# Patient Record
Sex: Male | Born: 1975 | Race: White | Hispanic: No | Marital: Single | Smoking: Current every day smoker
Health system: Southern US, Community
[De-identification: ages and names within clinical notes are randomized; demographics above are authoritative.]

## PROBLEM LIST (undated history)

## (undated) DIAGNOSIS — F1190 Opioid use, unspecified, uncomplicated: Secondary | ICD-10-CM

## (undated) DIAGNOSIS — Z720 Tobacco use: Secondary | ICD-10-CM

## (undated) DIAGNOSIS — I2510 Atherosclerotic heart disease of native coronary artery without angina pectoris: Secondary | ICD-10-CM

## (undated) DIAGNOSIS — F1910 Other psychoactive substance abuse, uncomplicated: Secondary | ICD-10-CM

## (undated) HISTORY — DX: Tobacco use: Z72.0

## (undated) HISTORY — DX: Opioid use, unspecified, uncomplicated: F11.90

## (undated) HISTORY — PX: MULTIPLE TOOTH EXTRACTIONS: SHX2053

## (undated) HISTORY — DX: Other psychoactive substance abuse, uncomplicated: F19.10

## (undated) HISTORY — DX: Atherosclerotic heart disease of native coronary artery without angina pectoris: I25.10

---

## 2006-07-02 ENCOUNTER — Inpatient Hospital Stay (HOSPITAL_COMMUNITY): Admission: AC | Admit: 2006-07-02 | Discharge: 2006-07-03 | Payer: Self-pay

## 2006-07-03 ENCOUNTER — Ambulatory Visit: Payer: Self-pay | Admitting: Dentistry

## 2006-07-18 ENCOUNTER — Ambulatory Visit (HOSPITAL_COMMUNITY): Admission: RE | Admit: 2006-07-18 | Discharge: 2006-07-18 | Payer: Self-pay | Admitting: Dentistry

## 2006-07-25 ENCOUNTER — Encounter: Admission: EM | Admit: 2006-07-25 | Discharge: 2006-07-25 | Payer: Self-pay | Admitting: Dentistry
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
## 2007-10-31 IMAGING — CR DG CHEST 2V
2 series · 2 of 2 positions shown · non-contrast
Comparison: none

CLINICAL DATA: Chronic periodontitis. 
 CHEST ? 2 VIEW:

[view not recorded (1 of 2)]
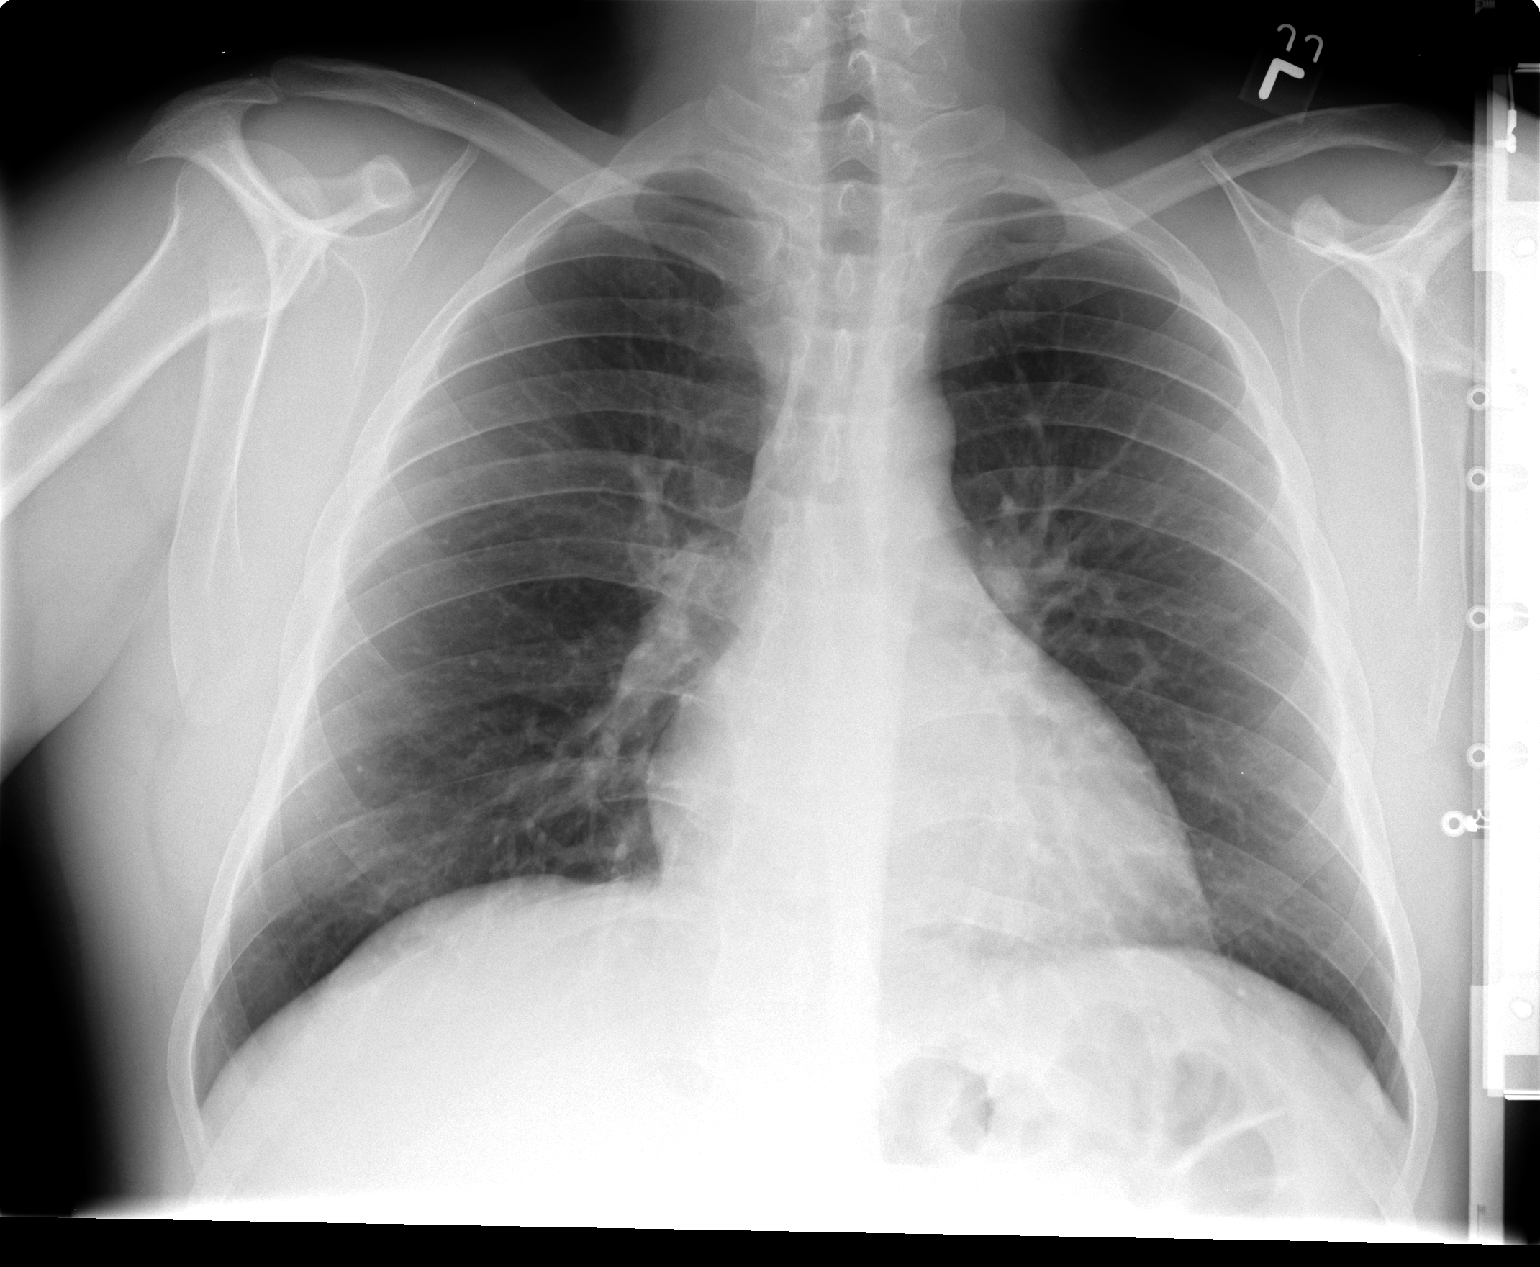

[view not recorded (2 of 2)]
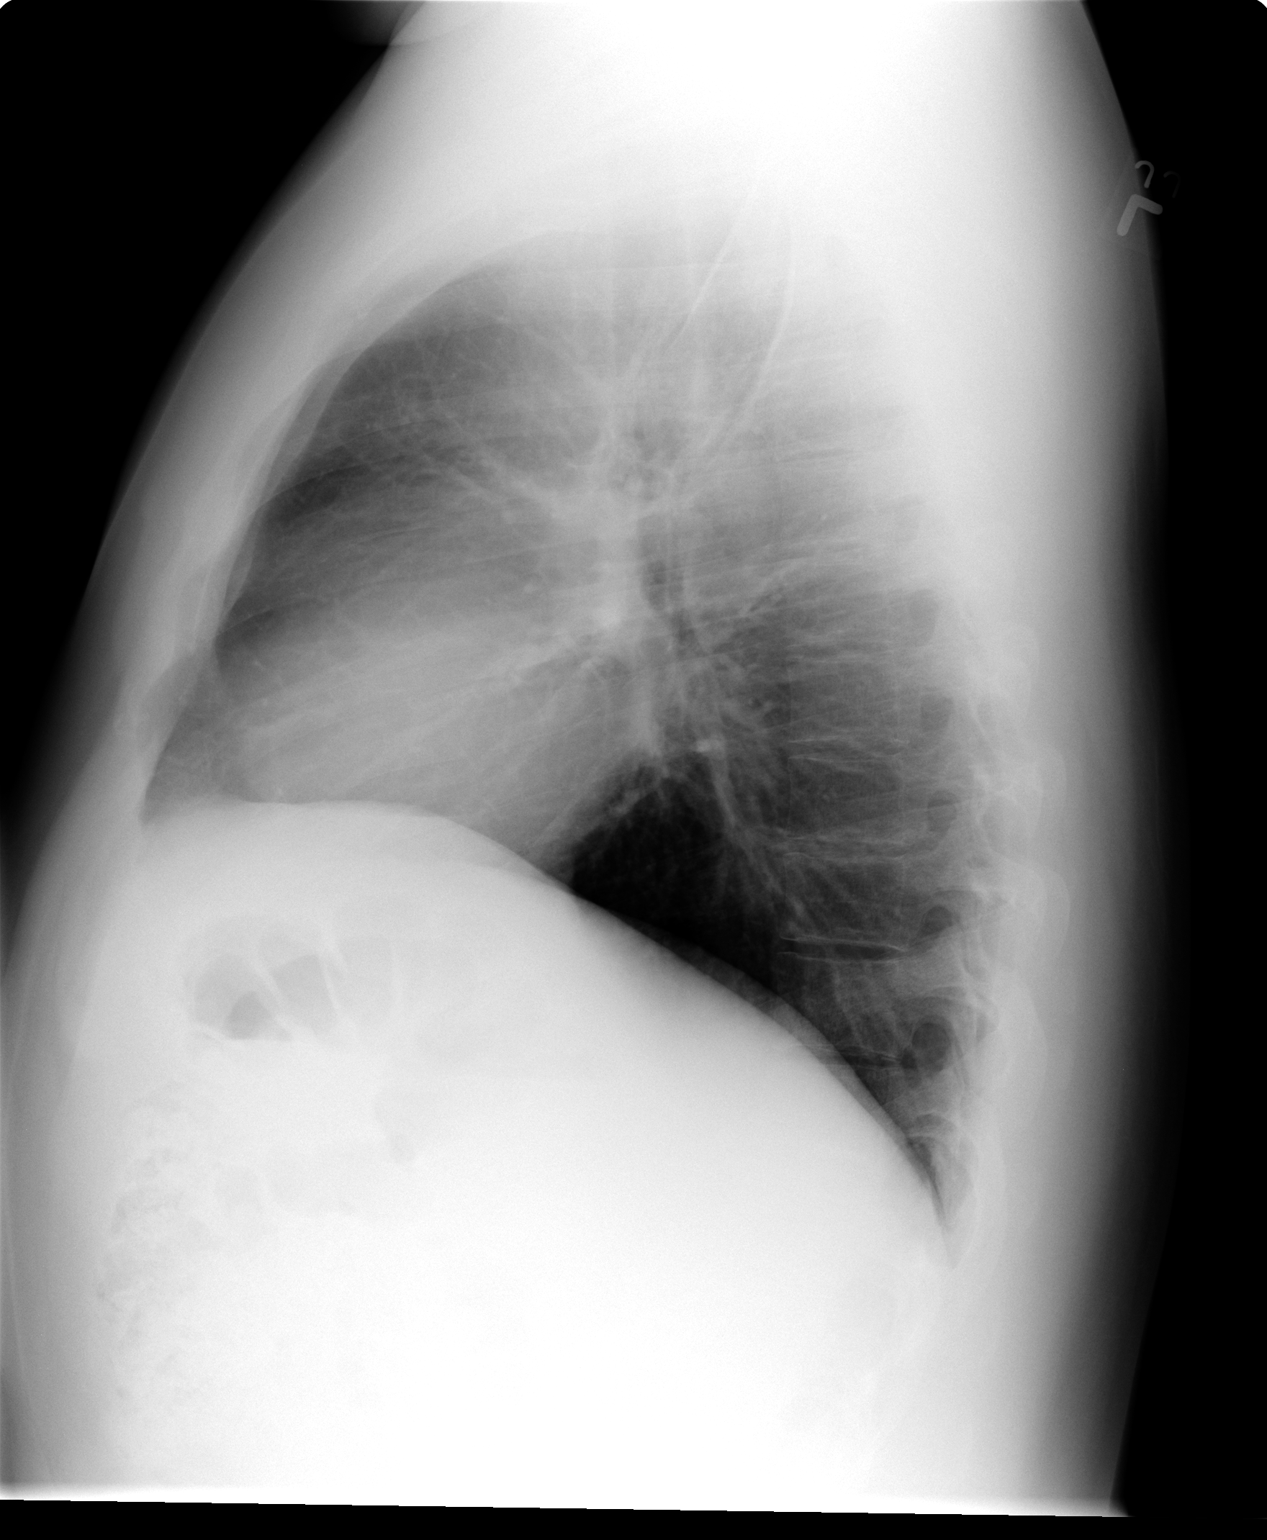

[2 of 2 positions shown; findings below may reference images not displayed]

FINDINGS: The heart size and mediastinal contours are within normal limits.  Both lungs are clear.  The visualized skeletal structures are unremarkable.
IMPRESSION: No active cardiopulmonary disease.

## 2021-02-25 DIAGNOSIS — J982 Interstitial emphysema: Secondary | ICD-10-CM | POA: Insufficient documentation

## 2021-02-25 DIAGNOSIS — F1190 Opioid use, unspecified, uncomplicated: Secondary | ICD-10-CM | POA: Insufficient documentation

## 2021-03-14 DIAGNOSIS — I2510 Atherosclerotic heart disease of native coronary artery without angina pectoris: Secondary | ICD-10-CM | POA: Insufficient documentation

## 2021-03-14 DIAGNOSIS — Z720 Tobacco use: Secondary | ICD-10-CM | POA: Insufficient documentation

## 2021-03-14 DIAGNOSIS — F1910 Other psychoactive substance abuse, uncomplicated: Secondary | ICD-10-CM | POA: Insufficient documentation

## 2022-08-20 ENCOUNTER — Ambulatory Visit: Payer: 59 | Admitting: Internal Medicine

## 2022-09-19 ENCOUNTER — Ambulatory Visit: Payer: 59 | Admitting: Internal Medicine

## 2022-09-19 ENCOUNTER — Encounter: Payer: Self-pay | Admitting: Internal Medicine

## 2022-09-19 VITALS — BP 104/66 | HR 89 | Temp 98.0°F | Ht 69.0 in | Wt 147.4 lb

## 2022-09-19 DIAGNOSIS — F1910 Other psychoactive substance abuse, uncomplicated: Secondary | ICD-10-CM | POA: Diagnosis not present

## 2022-09-19 DIAGNOSIS — K219 Gastro-esophageal reflux disease without esophagitis: Secondary | ICD-10-CM | POA: Diagnosis not present

## 2022-09-19 DIAGNOSIS — I2510 Atherosclerotic heart disease of native coronary artery without angina pectoris: Secondary | ICD-10-CM

## 2022-09-19 DIAGNOSIS — Z6821 Body mass index (BMI) 21.0-21.9, adult: Secondary | ICD-10-CM | POA: Diagnosis not present

## 2022-09-19 DIAGNOSIS — F1190 Opioid use, unspecified, uncomplicated: Secondary | ICD-10-CM

## 2022-09-19 MED ORDER — FAMOTIDINE 40 MG PO TABS: 40.0000 mg | ORAL_TABLET | Freq: Two times a day (BID) | ORAL | 2 refills | Status: DC

## 2022-09-19 MED ORDER — ASPIRIN 81 MG PO TBEC: 81.0000 mg | DELAYED_RELEASE_TABLET | Freq: Every day | ORAL | 12 refills | Status: AC

## 2022-09-19 MED ORDER — ATORVASTATIN CALCIUM 40 MG PO TABS: 40.0000 mg | ORAL_TABLET | Freq: Every day | ORAL | 2 refills | Status: DC
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
## 2022-09-19 NOTE — Assessment & Plan Note (Signed)
Plan as above.  

## 2022-09-19 NOTE — Assessment & Plan Note (Signed)
I obtained an EKG today and this showed NSR.  We will start him on ASA and a statin.  We will refer him back to cardiologist.

## 2022-09-19 NOTE — Assessment & Plan Note (Signed)
He has opoid use disorder with polysubstance abuse.  I am going to set him up with pain mangement who can have psychology and addiction specialities on board.  We will get him setup ASAP.

## 2022-09-19 NOTE — Assessment & Plan Note (Signed)
He descrbies some reflux.  We will start him on pepcid.

## 2022-09-21 LAB — CMP14 + ANION GAP
ALT: 14 IU/L (ref 0–44)
AST: 21 IU/L (ref 0–40)
Albumin/Globulin Ratio: 2.1 (ref 1.2–2.2)
Albumin: 4.2 g/dL (ref 4.1–5.1)
Alkaline Phosphatase: 97 IU/L (ref 44–121)
Anion Gap: 13 mmol/L (ref 10.0–18.0)
BUN/Creatinine Ratio: 16 (ref 9–20)
BUN: 15 mg/dL (ref 6–24)
Bilirubin Total: <0.2 mg/dL (ref 0.0–1.2)
CO2: 23 mmol/L (ref 20–29)
Calcium: 9 mg/dL (ref 8.7–10.2)
Chloride: 103 mmol/L (ref 96–106)
Creatinine, Ser: 0.93 mg/dL (ref 0.76–1.27)
Globulin, Total: 2 g/dL (ref 1.5–4.5)
Glucose: 82 mg/dL (ref 70–99)
Potassium: 5.3 mmol/L — ABNORMAL HIGH (ref 3.5–5.2)
Sodium: 139 mmol/L (ref 134–144)
Total Protein: 6.2 g/dL (ref 6.0–8.5)
eGFR: 103 mL/min/{1.73_m2} (ref >59)

## 2022-09-21 LAB — CBC WITH DIFFERENTIAL/PLATELET
Basophils Absolute: 0 10*3/uL (ref 0.0–0.2)
Basos: 1 %
EOS (ABSOLUTE): 0.3 10*3/uL (ref 0.0–0.4)
Eos: 4 %
Hematocrit: 40.7 % (ref 37.5–51.0)
Hemoglobin: 13 g/dL (ref 13.0–17.7)
Immature Grans (Abs): 0 10*3/uL (ref 0.0–0.1)
Immature Granulocytes: 0 %
Lymphocytes Absolute: 1.3 10*3/uL (ref 0.7–3.1)
Lymphs: 17 %
MCH: 29.5 pg (ref 26.6–33.0)
MCHC: 31.9 g/dL (ref 31.5–35.7)
MCV: 92 fL (ref 79–97)
Monocytes Absolute: 0.3 10*3/uL (ref 0.1–0.9)
Monocytes: 5 %
Neutrophils Absolute: 5.5 10*3/uL (ref 1.4–7.0)
Neutrophils: 73 %
Platelets: 267 10*3/uL (ref 150–450)
RBC: 4.41 x10E6/uL (ref 4.14–5.80)
RDW: 12.5 % (ref 11.6–15.4)
WBC: 7.5 10*3/uL (ref 3.4–10.8)

## 2022-09-21 LAB — LIPID PANEL
Chol/HDL Ratio: 3.1 ratio (ref 0.0–5.0)
Cholesterol, Total: 174 mg/dL (ref 100–199)
HDL: 57 mg/dL (ref >39)
LDL Chol Calc (NIH): 105 mg/dL — ABNORMAL HIGH (ref 0–99)
Triglycerides: 62 mg/dL (ref 0–149)
VLDL Cholesterol Cal: 12 mg/dL (ref 5–40)

## 2022-09-21 LAB — TSH: TSH: 1.59 u[IU]/mL (ref 0.450–4.500)

## 2022-10-09 ENCOUNTER — Other Ambulatory Visit: Payer: Self-pay

## 2022-10-09 MED ORDER — ATORVASTATIN CALCIUM 80 MG PO TABS: 80.0000 mg | ORAL_TABLET | Freq: Every day | ORAL | 1 refills | Status: AC

## 2022-10-18 ENCOUNTER — Encounter: Payer: Self-pay | Admitting: Cardiology

## 2022-10-18 ENCOUNTER — Ambulatory Visit: Payer: 59 | Attending: Cardiology | Admitting: Cardiology

## 2022-10-18 VITALS — BP 128/74 | HR 93 | Ht 67.0 in | Wt 152.2 lb

## 2022-10-18 DIAGNOSIS — F1910 Other psychoactive substance abuse, uncomplicated: Secondary | ICD-10-CM

## 2022-10-18 DIAGNOSIS — I2510 Atherosclerotic heart disease of native coronary artery without angina pectoris: Secondary | ICD-10-CM | POA: Diagnosis not present

## 2022-10-18 DIAGNOSIS — Z720 Tobacco use: Secondary | ICD-10-CM

## 2022-10-18 DIAGNOSIS — E785 Hyperlipidemia, unspecified: Secondary | ICD-10-CM | POA: Diagnosis not present

## 2022-10-18 NOTE — Addendum Note (Signed)
Addended by: Roxanne Mins I on: 10/18/2022 02:42 PM   Modules accepted: Orders

## 2022-10-18 NOTE — Progress Notes (Signed)
Cardiology Consultation:    Date:  10/18/2022   ID:  Niyan Schoof, DOB 1975-12-27, MRN [id number]  PCP:  Crist Fat, MD  Cardiologist:  Gypsy Balsam, MD   Referring MD: Leonia Reader, Barbara Cower, MD   Chief Complaint  Patient presents with   Coronary Artery Disease    H/o drug abuse    History of Present Illness:    Jeffrey Lara is a 47 y.o. male who is being seen today for the evaluation of coronary disease at the request of Crist Fat, MD. past medical history significant for alcoholism likely he quit drinking in 2016, used to drink 12 pack.  However he switched from drinking to opioids methamphetamine and fentanyl and he got clear out of days and the only thing he is right now is Suboxone.  He was referred to [place] because in 2022 he did have CT of his chest done when he got esophageal tear he was found to have calcification of the coronary artery.  He did see Dr. Desma Maxim who wanted him to have an echocardiogram as well as stress test but it never happened.  He also they are appropriately put him on aspirin and Lipitor however he never talked to his medication.  Finally he is getting things under control and he went to see his primary care physician who referred him to [place].  He complained of having pains all over the body pains and joint.  Also pain in the chest that happen in different situations he also noted swelling of lower extremities.  He denies have any palpitations.  He does have an easily shortness of breath while walking.  He does not exercise on regular basis, he is not on any diet.  Past Medical History:  Diagnosis Date   CAD (coronary artery disease)    Opioid use disorder    Polysubstance abuse (HCC)    Tobacco abuse     Past Surgical History:  Procedure Laterality Date   MULTIPLE TOOTH EXTRACTIONS      Current Medications: Current Meds  Medication Sig   aspirin EC 81 MG tablet Take 1 tablet (81 mg total) by mouth daily. Swallow whole.   atorvastatin (LIPITOR) 80  MG tablet Take 1 tablet (80 mg total) by mouth at bedtime.   famotidine (PEPCID) 40 MG tablet Take 1 tablet (40 mg total) by mouth 2 (two) times daily.     Allergies:   Meloxicam   Social History   Socioeconomic History   Marital status: Single    Spouse name: Not on file   Number of children: Not on file   Years of education: Not on file   Highest education level: Not on file  Occupational History   Not on file  Tobacco Use   Smoking status: Every Day    Packs/day: 2    Types: Cigarettes   Smokeless tobacco: Never  Substance and Sexual Activity   Alcohol use: Not on file   Drug use: Not on file   Sexual activity: Not on file  Other Topics Concern   Not on file  Social History Narrative   Not on file   Social Determinants of Health   Financial Resource Strain: Not on file  Food Insecurity: Not on file  Transportation Needs: Not on file  Physical Activity: Not on file  Stress: Not on file  Social Connections: Not on file     Family History: The patient's family history includes Diabetes in his father; Heart attack in  his father. ROS:   Please see the history of present illness.    All 14 point review of systems negative except as described per history of present illness.  EKGs/Labs/Other Studies Reviewed:    The following studies were reviewed today:   EKG:  EKG is  ordered today.  The ekg ordered today demonstrates normal sinus rhythm, normal P interval, normal QS complex duration morphology.  Recent Labs: 09/19/2022: ALT 14; BUN 15; Creatinine, Ser 0.93; Hemoglobin 13.0; Platelets 267; Potassium 5.3; Sodium 139; TSH 1.590  Recent Lipid Panel    Component Value Date/Time   CHOL 174 09/19/2022 1627   TRIG 62 09/19/2022 1627   HDL 57 09/19/2022 1627   CHOLHDL 3.1 09/19/2022 1627   LDLCALC 105 (H) 09/19/2022 1627    Physical Exam:    VS:  BP 128/74 (BP Location: Left Arm, Patient Position: Sitting)   Pulse 93   Ht 5\' 7"  (1.702 m)   Wt 152 lb 3.2 oz  (69 kg)   SpO2 97%   BMI 23.84 kg/m     Wt Readings from Last 3 Encounters:  10/18/22 152 lb 3.2 oz (69 kg)  09/19/22 147 lb 6.4 oz (66.9 kg)     GEN:  Well nourished, well developed in no acute distress HEENT: Normal NECK: No JVD; No carotid bruits LYMPHATICS: No lymphadenopathy CARDIAC: RRR, no murmurs, no rubs, no gallops RESPIRATORY:  Clear to auscultation without rales, wheezing or rhonchi  ABDOMEN: Soft, non-tender, non-distended MUSCULOSKELETAL:  No edema; No deformity  SKIN: Warm and dry NEUROLOGIC:  Alert and oriented x 3 PSYCHIATRIC:  Normal affect   ASSESSMENT:    1. Coronary artery disease involving native coronary artery of native heart without angina pectoris   2. Tobacco abuse   3. Polysubstance abuse (HCC)   4. Dyslipidemia    PLAN:    In order of problems listed above:  Coronary artery disease.  He does have calcification of the coronary arteries discovered in 2022 on CT it is difficult to judge his symptomatology since he complained of having a lot of pains in different portion of the body including chest pain.  He does have swelling of lower extremities on the physical exam, therefore, I suspect cardiomyopathy.  I will ask him to have echocardiogram and based on that we decide which way to proceed in terms of ablation of CAD.  He is already taking aspirin which is excellent.  He is already on statin. Tobacco abuse he said he smokes about 1 pack/day but he got his fingers yellow I suspect it is more than that, obviously he was told to quit smoking. Polysubstance abuse the only thing he is right now is Suboxone. Dyslipidemia again he was just recently put on Lipitor.  I did review K PN which show me data from September 19, 2022 with LDL 105 HDL 57. Is a sad life story of this gentleman who is life being destroyed by drug addiction however he was able to shake of his alcohol drinking addiction and now he to go away a methamphetamine, heroin, fentanyl.  Use Suboxone  only right now so I hope he is on the way to recovery.   Medication Adjustments/Labs and Tests Ordered: Current medicines are reviewed at length with the patient today.  Concerns regarding medicines are outlined above.  No orders of the defined types were placed in this encounter.  No orders of the defined types were placed in this encounter.   Signed, Georgeanna Lea, MD, [facility].  10/18/2022 2:31 PM    [facility]

## 2022-10-18 NOTE — Patient Instructions (Signed)
Medication Instructions:  Your physician recommends that you continue on your current medications as directed. Please refer to the Current Medication list given to you today.  *If you need a refill on your cardiac medications before your next appointment, please call your pharmacy*   Lab Work: None If you have labs (blood work) drawn today and your tests are completely normal, you will receive your results only by: MyChart Message (if you have MyChart) OR A paper copy in the mail If you have any lab test that is abnormal or we need to change your treatment, we will call you to review the results.   Testing/Procedures: Your physician has requested that you have an echocardiogram. Echocardiography is a painless test that uses sound waves to create images of your heart. It provides your doctor with information about the size and shape of your heart and how well your heart's chambers and valves are working. This procedure takes approximately one hour. There are no restrictions for this procedure. Please do NOT wear cologne, perfume, aftershave, or lotions (deodorant is allowed). Please arrive 15 minutes prior to your appointment time.    Follow-Up: At [facility], you and your health needs are our priority.  As part of our continuing mission to provide you with exceptional heart care, we have created designated Provider Care Teams.  These Care Teams include your primary Cardiologist (physician) and Advanced Practice Providers (APPs -  Physician Assistants and Nurse Practitioners) who all work together to provide you with the care you need, when you need it.  We recommend signing up for the patient portal called "MyChart".  Sign up information is provided on this After Visit Summary.  MyChart is used to connect with patients for Virtual Visits (Telemedicine).  Patients are able to view lab/test results, encounter notes, upcoming appointments, etc.  Non-urgent messages can be sent to your  provider as well.   To learn more about what you can do with MyChart, go to [email or web address].    Your next appointment:   6 week(s)  Provider:   Gypsy Balsam, MD    Other Instructions Patient declined EKG chaperone

## 2022-11-09 ENCOUNTER — Ambulatory Visit: Payer: 59 | Attending: Cardiology

## 2022-11-09 DIAGNOSIS — E785 Hyperlipidemia, unspecified: Secondary | ICD-10-CM | POA: Diagnosis not present

## 2022-11-09 DIAGNOSIS — Z720 Tobacco use: Secondary | ICD-10-CM | POA: Diagnosis not present

## 2022-11-09 DIAGNOSIS — F1910 Other psychoactive substance abuse, uncomplicated: Secondary | ICD-10-CM | POA: Diagnosis not present

## 2022-11-09 DIAGNOSIS — I2510 Atherosclerotic heart disease of native coronary artery without angina pectoris: Secondary | ICD-10-CM | POA: Diagnosis not present

## 2022-11-09 LAB — ECHOCARDIOGRAM COMPLETE
Area-P 1/2: 6.35 cm2
S' Lateral: 4.2 cm

## 2022-11-16 ENCOUNTER — Telehealth: Payer: Self-pay

## 2022-11-16 NOTE — Telephone Encounter (Signed)
Left message on My Chart with normal results per Dr. Krasowski's note. Routed to PCP. 

## 2022-12-05 ENCOUNTER — Telehealth (HOSPITAL_COMMUNITY): Payer: Self-pay | Admitting: *Deleted

## 2022-12-05 ENCOUNTER — Encounter: Payer: Self-pay | Admitting: Cardiology

## 2022-12-05 ENCOUNTER — Ambulatory Visit: Payer: 59 | Attending: Cardiology | Admitting: Cardiology

## 2022-12-05 VITALS — BP 122/74 | HR 117 | Ht 68.0 in | Wt 147.4 lb

## 2022-12-05 DIAGNOSIS — E785 Hyperlipidemia, unspecified: Secondary | ICD-10-CM

## 2022-12-05 DIAGNOSIS — Z720 Tobacco use: Secondary | ICD-10-CM

## 2022-12-05 DIAGNOSIS — K219 Gastro-esophageal reflux disease without esophagitis: Secondary | ICD-10-CM

## 2022-12-05 DIAGNOSIS — I2510 Atherosclerotic heart disease of native coronary artery without angina pectoris: Secondary | ICD-10-CM

## 2022-12-05 NOTE — Patient Instructions (Addendum)
Medication Instructions:  Your physician recommends that you continue on your current medications as directed. Please refer to the Current Medication list given to you today.  *If you need a refill on your cardiac medications before your next appointment, please call your pharmacy*   Lab Work: None Ordered If you have labs (blood work) drawn today and your tests are completely normal, you will receive your results only by: MyChart Message (if you have MyChart) OR A paper copy in the mail If you have any lab test that is abnormal or we need to change your treatment, we will call you to review the results.   Testing/Procedures: Your physician has requested that you have a Exercise Cardiolite. For further information please visit [email or web address]. Please follow instruction sheet, as given.  The test will take approximately 3 to 4 hours to complete; you may bring reading material.  If someone comes with you to your appointment, they will need to remain in the main lobby due to limited space in the testing area.    How to prepare for your Myocardial Perfusion Test: Do not eat or drink 3 hours prior to your test, except you may have water. Do not consume products containing caffeine (regular or decaffeinated) 12 hours prior to your test. (ex: coffee, chocolate, sodas, tea). Do bring a list of your current medications with you.  If not listed below, you may take your medications as normal. Do wear comfortable clothes (no dresses or overalls) and walking shoes, tennis shoes preferred (No heels or open toe shoes are allowed). Do NOT wear cologne, perfume, aftershave, or lotions (deodorant is allowed). If these instructions are not followed, your test will have to be rescheduled.     Follow-Up: At [facility], you and your health needs are our priority.  As part of our continuing mission to provide you with exceptional heart care, we have created designated Provider Care Teams.  These Care  Teams include your primary Cardiologist (physician) and Advanced Practice Providers (APPs -  Physician Assistants and Nurse Practitioners) who all work together to provide you with the care you need, when you need it.  We recommend signing up for the patient portal called "MyChart".  Sign up information is provided on this After Visit Summary.  MyChart is used to connect with patients for Virtual Visits (Telemedicine).  Patients are able to view lab/test results, encounter notes, upcoming appointments, etc.  Non-urgent messages can be sent to your provider as well.   To learn more about what you can do with MyChart, go to [email or web address].    Your next appointment:   3 month(s)  The format for your next appointment:   In Person  Provider:   Robert Krasowski, MD    Other Instructions NA  

## 2022-12-05 NOTE — Progress Notes (Signed)
Cardiology Office Note:    Date:  12/05/2022   ID:  Jeffrey Lara, DOB 16-Mar-1976, MRN [id number]  PCP:  Crist Fat, MD  Cardiologist:  Gypsy Balsam, MD    Referring MD: Leonia Reader, Barbara Cower, MD   Chief Complaint  Patient presents with   Follow-up  Doing fine  History of Present Illness:    Jeffrey Lara is a 47 y.o. male past medical history significant for coronary artery disease, he did have CT which showed calcification of the coronary artery.  He was also a drug addict however likely not staying clean, sent to [place] for evaluation.  He did have echocardiogram echocardiogram showed preserved left ventricle ejection fraction based on that we supposed to decide how we can evaluate him for coronary artery disease in which he is echocardiogram showing normal left ventricle ejection fraction I will schedule him to have a stress test.  He seems to be somewhat restless today.  He comes with his mother to our office.  Described to have some atypical chest pain but have difficulty describing precise nature of it.  Past Medical History:  Diagnosis Date   CAD (coronary artery disease)    Opioid use disorder    Polysubstance abuse (HCC)    Tobacco abuse     Past Surgical History:  Procedure Laterality Date   MULTIPLE TOOTH EXTRACTIONS      Current Medications: Current Meds  Medication Sig   aspirin EC 81 MG tablet Take 1 tablet (81 mg total) by mouth daily. Swallow whole.   atorvastatin (LIPITOR) 80 MG tablet Take 1 tablet (80 mg total) by mouth at bedtime.   famotidine (PEPCID) 40 MG tablet Take 1 tablet (40 mg total) by mouth 2 (two) times daily.     Allergies:   Meloxicam   Social History   Socioeconomic History   Marital status: Single    Spouse name: Not on file   Number of children: Not on file   Years of education: Not on file   Highest education level: Not on file  Occupational History   Not on file  Tobacco Use   Smoking status: Every Day    Packs/day: 2    Types:  Cigarettes   Smokeless tobacco: Never  Substance and Sexual Activity   Alcohol use: Not on file   Drug use: Not on file   Sexual activity: Not on file  Other Topics Concern   Not on file  Social History Narrative   Not on file   Social Determinants of Health   Financial Resource Strain: Not on file  Food Insecurity: Not on file  Transportation Needs: Not on file  Physical Activity: Not on file  Stress: Not on file  Social Connections: Not on file     Family History: The patient's family history includes Diabetes in his father; Heart attack in his father. ROS:   Please see the history of present illness.    All 14 point review of systems negative except as described per history of present illness  EKGs/Labs/Other Studies Reviewed:         Recent Labs: 09/19/2022: ALT 14; BUN 15; Creatinine, Ser 0.93; Hemoglobin 13.0; Platelets 267; Potassium 5.3; Sodium 139; TSH 1.590  Recent Lipid Panel    Component Value Date/Time   CHOL 174 09/19/2022 1627   TRIG 62 09/19/2022 1627   HDL 57 09/19/2022 1627   CHOLHDL 3.1 09/19/2022 1627   LDLCALC 105 (H) 09/19/2022 1627    Physical Exam:  VS:  BP 122/74 (BP Location: Left Arm, Patient Position: Sitting)   Pulse (!) 117   Ht 5\' 8"  (1.727 m)   Wt 147 lb 6.4 oz (66.9 kg)   SpO2 97%   BMI 22.41 kg/m     Wt Readings from Last 3 Encounters:  12/05/22 147 lb 6.4 oz (66.9 kg)  10/18/22 152 lb 3.2 oz (69 kg)  09/19/22 147 lb 6.4 oz (66.9 kg)     GEN:  Well nourished, well developed in no acute distress HEENT: Normal NECK: No JVD; No carotid bruits LYMPHATICS: No lymphadenopathy CARDIAC: RRR, no murmurs, no rubs, no gallops RESPIRATORY:  Clear to auscultation without rales, wheezing or rhonchi  ABDOMEN: Soft, non-tender, non-distended MUSCULOSKELETAL:  No edema; No deformity  SKIN: Warm and dry LOWER EXTREMITIES: no swelling NEUROLOGIC:  Alert and oriented x 3 PSYCHIATRIC:  Normal affect   ASSESSMENT:    1. Coronary  artery disease involving native coronary artery of native heart without angina pectoris   2. Gastroesophageal reflux disease, unspecified whether esophagitis present   3. Tobacco abuse   4. Dyslipidemia    PLAN:    In order of problems listed above:  Calcification of the coronary artery, we will schedule him to have a stress test.  The idea will be to rule out obstructive disease.  In the meantime we will continue aspirin and statin. Dyslipidemia he is on high intense statin.  I did review K PN which show me his LDL at 105 HDL 57 this is from April 17.  Will schedule him to have fasting lipid profile done. Tobacco abuse he understand he needs to quit.   Medication Adjustments/Labs and Tests Ordered: Current medicines are reviewed at length with the patient today.  Concerns regarding medicines are outlined above.  No orders of the defined types were placed in this encounter.  Medication changes: No orders of the defined types were placed in this encounter.   Signed, Georgeanna Lea, MD, [facility] 12/05/2022 1:27 PM    [facility]

## 2022-12-05 NOTE — Addendum Note (Signed)
Addended by: Baldo Ash D on: 12/05/2022 01:49 PM   Modules accepted: Orders

## 2022-12-05 NOTE — Telephone Encounter (Signed)
Left message on voicemail per DPR in reference to upcoming appointment scheduled on 12/11/2022 at 11:00 with detailed instructions given per Myocardial Perfusion Study Information Sheet for the test. LM to arrive 15 minutes early, and that it is imperative to arrive on time for appointment to keep from having the test rescheduled. If you need to cancel or reschedule your appointment, please call the office within 24 hours of your appointment. Failure to do so may result in a cancellation of your appointment, and a $50 no show fee. Phone number given for call back for any questions.

## 2022-12-11 ENCOUNTER — Ambulatory Visit: Payer: 59 | Attending: Cardiology

## 2022-12-17 ENCOUNTER — Ambulatory Visit: Payer: 59 | Admitting: Internal Medicine

## 2022-12-25 ENCOUNTER — Encounter: Payer: Self-pay | Admitting: General Practice

## 2022-12-26 ENCOUNTER — Encounter: Payer: Self-pay | Admitting: Internal Medicine

## 2022-12-26 ENCOUNTER — Ambulatory Visit: Payer: 59 | Admitting: Internal Medicine

## 2022-12-26 VITALS — BP 110/60 | HR 90 | Temp 98.2°F | Resp 16 | Ht 68.0 in | Wt 150.8 lb

## 2022-12-26 DIAGNOSIS — E7800 Pure hypercholesterolemia, unspecified: Secondary | ICD-10-CM | POA: Diagnosis not present

## 2022-12-26 DIAGNOSIS — I2510 Atherosclerotic heart disease of native coronary artery without angina pectoris: Secondary | ICD-10-CM

## 2022-12-26 DIAGNOSIS — F1910 Other psychoactive substance abuse, uncomplicated: Secondary | ICD-10-CM | POA: Diagnosis not present

## 2022-12-26 NOTE — Assessment & Plan Note (Signed)
I had a discussion with him regarding his use.  He tried to get into a suboxone clinic on his own.  I asked him to go to [facility] (their number was provided) for treatment.
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
## 2022-12-26 NOTE — Assessment & Plan Note (Signed)
I reviewed his notes from Cardiology.  They are setting him up for a stress test.

## 2022-12-26 NOTE — Assessment & Plan Note (Signed)
We will check his FLP tomorrow when is fasting.

## 2022-12-28 ENCOUNTER — Ambulatory Visit: Payer: 59

## 2022-12-28 DIAGNOSIS — E7800 Pure hypercholesterolemia, unspecified: Secondary | ICD-10-CM | POA: Diagnosis not present

## 2023-01-02 ENCOUNTER — Telehealth: Payer: Self-pay

## 2023-01-02 NOTE — Telephone Encounter (Signed)
-----   Message from Michel Santee Eyk sent at 12/30/2022  7:00 PM EDT ----- His cholesterol is controlled.

## 2023-01-02 NOTE — Telephone Encounter (Signed)
Tried contacting pt twice but phone appears to be disconeected. Will mail a letter with results

## 2023-01-04 NOTE — Progress Notes (Signed)
His cholesterol looks good.  Pt letter mailed

## 2023-01-17 DIAGNOSIS — F411 Generalized anxiety disorder: Secondary | ICD-10-CM | POA: Diagnosis not present

## 2023-01-17 DIAGNOSIS — F1120 Opioid dependence, uncomplicated: Secondary | ICD-10-CM | POA: Diagnosis not present

## 2023-01-17 DIAGNOSIS — Z6824 Body mass index (BMI) 24.0-24.9, adult: Secondary | ICD-10-CM | POA: Diagnosis not present

## 2023-01-17 DIAGNOSIS — Z79899 Other long term (current) drug therapy: Secondary | ICD-10-CM | POA: Diagnosis not present

## 2023-01-17 DIAGNOSIS — F331 Major depressive disorder, recurrent, moderate: Secondary | ICD-10-CM | POA: Diagnosis not present

## 2023-03-04 DIAGNOSIS — F1120 Opioid dependence, uncomplicated: Secondary | ICD-10-CM | POA: Diagnosis not present

## 2023-03-04 DIAGNOSIS — F32A Depression, unspecified: Secondary | ICD-10-CM | POA: Diagnosis not present

## 2023-03-04 DIAGNOSIS — F419 Anxiety disorder, unspecified: Secondary | ICD-10-CM | POA: Diagnosis not present

## 2023-03-04 DIAGNOSIS — F17200 Nicotine dependence, unspecified, uncomplicated: Secondary | ICD-10-CM | POA: Diagnosis not present

## 2023-03-06 NOTE — Progress Notes (Signed)
 " Cardiology Office Note:  .   Date:  03/07/2023  ID:  Jeffrey Lara, DOB 10-Apr-1976, MRN [id number] PCP: Fleeta Valeria Mayo, MD  [facility] Providers Cardiologist:  None    History of Present Illness: .    Jeffrey Lara is a 47 y.o. male with a past medical history of CAD per CT of chest, pneumomediastinum, GERD, history of polysubstance abuse, tobacco abuse, dyslipidemia.  11/09/22 echo EF 60-65%, no valvular abnormalities 02/22/2021 CT chest mild coronary artery calcifications  Most recently evaluated by Dr. Bernie on 12/05/2022, he reported chest pain that did not sound cardiac in nature however was having difficulty describing it.  An arrangement was made for an MPI however it does not appear this was completed.  He presents today for follow-up of his coronary artery disease.  He has been bothered by DOE, occasionally by what sounds to be noncardiac type chest pain.  Recently was evaluated in the ED for chest pain, troponin negative, EKG without changes. He has occasional pedal edema. He denies palpitations, dyspnea, pnd, orthopnea, n, v, dizziness, syncope, weight gain, or early satiety.   ROS: Review of Systems  Cardiovascular:  Positive for chest pain and leg swelling.  All other systems reviewed and are negative.    Studies Reviewed: .       Cardiac Studies & Procedures       ECHOCARDIOGRAM  ECHOCARDIOGRAM COMPLETE 11/09/2022  Narrative ECHOCARDIOGRAM REPORT    Patient Name:   Jeffrey Lara Date of Exam: 11/09/2022 Medical Rec #:  [id number]   Height:       67.0 in Accession #:    [id number]  Weight:       152.2 lb Date of Birth:  19-Apr-1976   BSA:          1.801 m Patient Age:    46 years    BP:           128/74 mmHg Patient Gender: M           HR:           100 bpm. Exam Location:  [place]  Procedure: 2D Echo, Cardiac Doppler, Color Doppler and Strain Analysis  Indications:    Coronary artery disease involving native coronary artery of native heart without angina  pectoris [I25.10 (ICD-10-CM)]; Tobacco abuse [Z72.0 (ICD-10-CM)]; Polysubstance abuse (HCC) [F19.10 (ICD-10-CM)]; Dyslipidemia [E78.5 (ICD-10-CM)]  History:        Patient has no prior history of Echocardiogram examinations. CAD; Risk Factors:Current Smoker and Dyslipidemia. Polysubstance abuse (HCC).  Sonographer:    Charlie Jointer RDCS Referring Phys: [id number] ROBERT J KRASOWSKI  IMPRESSIONS   1. Vertcal orientation of LV on parasernal views Resting sinus tachycardia 110 BPM . Left ventricular ejection fraction, by estimation, is 60 to 65%. The left ventricle has normal function. The left ventricle has no regional wall motion abnormalities. Left ventricular diastolic parameters were normal. 2. Right ventricular systolic function is normal. The right ventricular size is normal. 3. The mitral valve is normal in structure. No evidence of mitral valve regurgitation. No evidence of mitral stenosis. 4. The aortic valve is tricuspid. Aortic valve regurgitation is not visualized. No aortic stenosis is present. 5. The inferior vena cava is normal in size with greater than 50% respiratory variability, suggesting right atrial pressure of 3 mmHg.  FINDINGS Left Ventricle: Vertcal orientation of LV on parasernal views Resting sinus tachycardia 110 BPM. Left ventricular ejection fraction, by estimation, is 60 to 65%. The left ventricle has normal function. The  left ventricle has no regional wall motion abnormalities. The left ventricular internal cavity size was normal in size. There is no left ventricular hypertrophy. Left ventricular diastolic parameters were normal. Normal left ventricular filling pressure.  Right Ventricle: The right ventricular size is normal. No increase in right ventricular wall thickness. Right ventricular systolic function is normal.  Left Atrium: Left atrial size was normal in size.  Right Atrium: Right atrial size was normal in size.  Pericardium: There is no  evidence of pericardial effusion.  Mitral Valve: The mitral valve is normal in structure. No evidence of mitral valve regurgitation. No evidence of mitral valve stenosis.  Tricuspid Valve: The tricuspid valve is normal in structure. Tricuspid valve regurgitation is trivial. No evidence of tricuspid stenosis.  Aortic Valve: The aortic valve is tricuspid. Aortic valve regurgitation is not visualized. No aortic stenosis is present.  Pulmonic Valve: The pulmonic valve was normal in structure. Pulmonic valve regurgitation is not visualized. No evidence of pulmonic stenosis.  Aorta: The aortic root, ascending aorta, aortic arch and descending aorta are all structurally normal, with no evidence of dilitation or obstruction.  Venous: A normal flow pattern is recorded from the right upper pulmonary vein. The inferior vena cava is normal in size with greater than 50% respiratory variability, suggesting right atrial pressure of 3 mmHg.  IAS/Shunts: No atrial level shunt detected by color flow Doppler.   LEFT VENTRICLE PLAX 2D LVIDd:         5.30 cm   Diastology LVIDs:         4.20 cm   LV e' medial:    11.30 cm/s LV PW:         0.80 cm   LV E/e' medial:  8.3 LV IVS:        0.70 cm   LV e' lateral:   18.70 cm/s LVOT diam:     2.00 cm   LV E/e' lateral: 5.0 LV SV:         59 LV SV Index:   33        2D Longitudinal Strain LVOT Area:     3.14 cm  2D Strain GLS Avg:     -19.9 %   RIGHT VENTRICLE             IVC RV Basal diam:  3.30 cm     IVC diam: 1.70 cm RV Mid diam:    2.40 cm RV S prime:     18.00 cm/s TAPSE (M-mode): 2.5 cm  LEFT ATRIUM             Index        RIGHT ATRIUM           Index LA diam:        3.00 cm 1.67 cm/m   RA Area:     12.00 cm LA Vol (A2C):   39.0 ml 21.66 ml/m  RA Volume:   28.60 ml  15.88 ml/m LA Vol (A4C):   33.2 ml 18.44 ml/m LA Biplane Vol: 38.2 ml 21.21 ml/m AORTIC VALVE LVOT Vmax:   121.67 cm/s LVOT Vmean:  81.400 cm/s LVOT VTI:    0.186  m  AORTA Ao Root diam: 3.80 cm Ao Asc diam:  3.00 cm Ao Desc diam: 2.00 cm  MITRAL VALVE MV Area (PHT): 6.35 cm    SHUNTS MV Decel Time: 120 msec    Systemic VTI:  0.19 m MV E velocity: 93.55 cm/s  Systemic Diam: 2.00 cm MV A  velocity: 84.90 cm/s MV E/A ratio:  1.10  Redell Leiter MD Electronically signed by Redell Leiter MD Signature Date/Time: 11/09/2022/5:00:06 PM    Final             Risk Assessment/Calculations:             Physical Exam:   VS:  BP 110/68 (BP Location: Left Arm, Patient Position: Sitting)   Pulse 85   Ht 5' 7 (1.702 m)   Wt 167 lb 9.6 oz (76 kg)   SpO2 97%   BMI 26.25 kg/m    Wt Readings from Last 3 Encounters:  03/07/23 167 lb 9.6 oz (76 kg)  12/26/22 150 lb 12.8 oz (68.4 kg)  12/05/22 147 lb 6.4 oz (66.9 kg)    GEN: Well nourished, well developed in no acute distress NECK: No JVD; No carotid bruits CARDIAC: RRR, no murmurs, rubs, gallops RESPIRATORY:  Clear to auscultation without rales, wheezing or rhonchi  ABDOMEN: Soft, non-tender, non-distended EXTREMITIES: +1 pitting edema bilaterally; No deformity   ASSESSMENT AND PLAN: .   Coronary artery calcifications - noted on CT imaging, continue aspirin  81 mg daily, continue Lipitor 80 mg daily. He has some chest pain but it sound to be non-cardiac in nature but he does have comorbid conditions and we need to evaluate further. Discussed cCTA vs lexiscan, and he would like to proceed with a lexiscan.  Dyslipidemia - most recent LDL was well controlled at 54, continue Lipitor 80 mg daily Tobacco abuse-currently smoking 1 PPD, we did discuss cessation however this is complicated by his former fentanyl use as he used this to help stop that addiction. Will address further at next OV.  Pedal edema - echo revealed an EF of 60 to 65%, no diastolic dysfunction.  Recent potassium 4.4 and creatinine 0.88.  Will give him Lasix  20 mg to take as needed for pedal edema.  Will repeat BMET in 2 weeks.       Informed Consent   Shared Decision Making/Informed Consent The risks [chest pain, shortness of breath, cardiac arrhythmias, dizziness, blood pressure fluctuations, myocardial infarction, stroke/transient ischemic attack, nausea, vomiting, allergic reaction, radiation exposure, metallic taste sensation and life-threatening complications (estimated to be 1 in 10,000)], benefits (risk stratification, diagnosing coronary artery disease, treatment guidance) and alternatives of a nuclear stress test were discussed in detail with Jeffrey Lara and he agrees to proceed.     Dispo: Lexiscan, Lasix  20 mg as needed for pedal edema, repeat BMET in 2 weeks.  Return to general cardiology in 6 months.  Signed, Delon JAYSON Hoover, NP  "

## 2023-03-07 ENCOUNTER — Encounter: Payer: Self-pay | Admitting: Cardiology

## 2023-03-07 ENCOUNTER — Ambulatory Visit: Payer: 59 | Attending: Cardiology | Admitting: Cardiology

## 2023-03-07 ENCOUNTER — Telehealth (HOSPITAL_COMMUNITY): Payer: Self-pay | Admitting: *Deleted

## 2023-03-07 VITALS — BP 110/68 | HR 85 | Ht 67.0 in | Wt 167.6 lb

## 2023-03-07 DIAGNOSIS — K219 Gastro-esophageal reflux disease without esophagitis: Secondary | ICD-10-CM

## 2023-03-07 DIAGNOSIS — E785 Hyperlipidemia, unspecified: Secondary | ICD-10-CM

## 2023-03-07 DIAGNOSIS — I2510 Atherosclerotic heart disease of native coronary artery without angina pectoris: Secondary | ICD-10-CM | POA: Diagnosis not present

## 2023-03-07 DIAGNOSIS — R079 Chest pain, unspecified: Secondary | ICD-10-CM | POA: Diagnosis not present

## 2023-03-07 DIAGNOSIS — R072 Precordial pain: Secondary | ICD-10-CM

## 2023-03-07 DIAGNOSIS — Z79899 Other long term (current) drug therapy: Secondary | ICD-10-CM | POA: Diagnosis not present

## 2023-03-07 DIAGNOSIS — Z720 Tobacco use: Secondary | ICD-10-CM

## 2023-03-07 DIAGNOSIS — F1910 Other psychoactive substance abuse, uncomplicated: Secondary | ICD-10-CM | POA: Diagnosis not present

## 2023-03-07 MED ORDER — FUROSEMIDE 20 MG PO TABS: 20.0000 mg | ORAL_TABLET | Freq: Every day | ORAL | 3 refills | Status: AC | PRN

## 2023-03-07 NOTE — Patient Instructions (Signed)
Medication Instructions:  Your physician has recommended you make the following change in your medication:  Start Furosemide 20 mg once daily as needed for leg swelling  *If you need a refill on your cardiac medications before your next appointment, please call your pharmacy*   Lab Work: Your physician recommends that you return for lab work in: 2 Weeks for a BMP Lab opens at 8am. You DO NOT NEED an appointment. Best time to come is between 8am and 12noon and between 1:30 and 4:30. If you have been asked to fast for your blood work please have nothing to eat or drink after midnight. You may have water.   If you have labs (blood work) drawn today and your tests are completely normal, you will receive your results only by: MyChart Message (if you have MyChart) OR A paper copy in the mail If you have any lab test that is abnormal or we need to change your treatment, we will call you to review the results.   Testing/Procedures: Your physician has requested that you have a lexiscan myoview. For further information please visit [email or web address] Please follow instruction sheet, as given.   The test will take approximately 3 to 4 hours to complete; you may bring reading material.  If someone comes with you to your appointment, they will need to remain in the main lobby due to limited space in the testing area.  How to prepare for your Myocardial Perfusion Test:             Do not take Erectile Dysfunction medication 48 hours prior to test. Do not eat or drink 3 hours prior to your test, except you may have water. Do not consume products containing caffeine (regular or decaffeinated) 12 hours prior to your test. (ex: coffee, chocolate, sodas, tea). Do bring a list of your current medications with you.  If not listed below, you may take your medications as normal. Do wear comfortable clothes (no dresses or overalls) and walking shoes, tennis shoes preferred (No heels or open toe shoes are  allowed). Do NOT wear cologne, perfume, aftershave, or lotions (deodorant is allowed). If these instructions are not followed, your test will have to be rescheduled.    Follow-Up: At [facility], you and your health needs are our priority.  As part of our continuing mission to provide you with exceptional heart care, we have created designated Provider Care Teams.  These Care Teams include your primary Cardiologist (physician) and Advanced Practice Providers (APPs -  Physician Assistants and Nurse Practitioners) who all work together to provide you with the care you need, when you need it.  We recommend signing up for the patient portal called "MyChart".  Sign up information is provided on this After Visit Summary.  MyChart is used to connect with patients for Virtual Visits (Telemedicine).  Patients are able to view lab/test results, encounter notes, upcoming appointments, etc.  Non-urgent messages can be sent to your provider as well.   To learn more about what you can do with MyChart, go to [email or web address].    Your next appointment:   6 month(s)  Provider:   Gypsy Balsam, MD    Other Instructions

## 2023-03-07 NOTE — Telephone Encounter (Signed)
Per DPR left detailed instructions for MPI

## 2023-03-13 ENCOUNTER — Ambulatory Visit: Payer: 59

## 2023-03-27 ENCOUNTER — Ambulatory Visit: Payer: 59 | Admitting: Internal Medicine

## 2023-03-27 ENCOUNTER — Encounter: Payer: Self-pay | Admitting: Internal Medicine

## 2023-03-27 VITALS — BP 136/82 | HR 80 | Temp 98.3°F | Resp 18 | Ht 68.0 in | Wt 157.6 lb

## 2023-03-27 DIAGNOSIS — E7800 Pure hypercholesterolemia, unspecified: Secondary | ICD-10-CM | POA: Diagnosis not present

## 2023-03-27 DIAGNOSIS — E785 Hyperlipidemia, unspecified: Secondary | ICD-10-CM | POA: Diagnosis not present

## 2023-03-27 DIAGNOSIS — Z87891 Personal history of nicotine dependence: Secondary | ICD-10-CM | POA: Diagnosis not present

## 2023-03-27 DIAGNOSIS — K219 Gastro-esophageal reflux disease without esophagitis: Secondary | ICD-10-CM

## 2023-03-27 DIAGNOSIS — F1910 Other psychoactive substance abuse, uncomplicated: Secondary | ICD-10-CM | POA: Diagnosis not present

## 2023-03-27 DIAGNOSIS — I2510 Atherosclerotic heart disease of native coronary artery without angina pectoris: Secondary | ICD-10-CM

## 2023-03-27 MED ORDER — PANTOPRAZOLE SODIUM 40 MG PO TBEC: 40.0000 mg | DELAYED_RELEASE_TABLET | Freq: Every day | ORAL | 1 refills | Status: AC

## 2023-03-27 MED ORDER — GABAPENTIN 100 MG PO CAPS: ORAL_CAPSULE | ORAL | 0 refills | Status: DC

## 2023-03-27 NOTE — Assessment & Plan Note (Signed)
His CAD is stable and he denies any problems with angina today.  We will continue risk factor modification and his ASA.

## 2023-03-27 NOTE — Assessment & Plan Note (Addendum)
He is seeing a service provider for his suboxone.  He is also trying to get setup to see [facility].  He is complaining of some nerve pain in his right hand.  I will try him on a trial of gabapentin.  He may need an EMG.
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
## 2023-03-27 NOTE — Assessment & Plan Note (Signed)
He is having worsening reflux.  I will switch his pepcid to protonix.

## 2023-03-27 NOTE — Progress Notes (Signed)
Follow up.

## 2023-03-27 NOTE — Assessment & Plan Note (Signed)
His FLP on his last visit showed his LDL was controlled.  I had a discussion about tobacco cessation.  He is actively trying to cut back.

## 2023-04-01 ENCOUNTER — Encounter: Payer: Self-pay | Admitting: Cardiology

## 2023-04-04 DIAGNOSIS — F17200 Nicotine dependence, unspecified, uncomplicated: Secondary | ICD-10-CM | POA: Diagnosis not present

## 2023-04-04 DIAGNOSIS — I10 Essential (primary) hypertension: Secondary | ICD-10-CM | POA: Diagnosis not present

## 2023-04-04 DIAGNOSIS — Z114 Encounter for screening for human immunodeficiency virus [HIV]: Secondary | ICD-10-CM | POA: Diagnosis not present

## 2023-04-04 DIAGNOSIS — F32A Depression, unspecified: Secondary | ICD-10-CM | POA: Diagnosis not present

## 2023-04-04 DIAGNOSIS — Z1159 Encounter for screening for other viral diseases: Secondary | ICD-10-CM | POA: Diagnosis not present

## 2023-04-04 DIAGNOSIS — F1120 Opioid dependence, uncomplicated: Secondary | ICD-10-CM | POA: Diagnosis not present

## 2023-04-04 DIAGNOSIS — F419 Anxiety disorder, unspecified: Secondary | ICD-10-CM | POA: Diagnosis not present

## 2023-04-29 ENCOUNTER — Other Ambulatory Visit: Payer: Self-pay | Admitting: Internal Medicine

## 2023-05-04 DIAGNOSIS — F17200 Nicotine dependence, unspecified, uncomplicated: Secondary | ICD-10-CM | POA: Diagnosis not present

## 2023-05-04 DIAGNOSIS — F32A Depression, unspecified: Secondary | ICD-10-CM | POA: Diagnosis not present

## 2023-05-04 DIAGNOSIS — F1120 Opioid dependence, uncomplicated: Secondary | ICD-10-CM | POA: Diagnosis not present

## 2023-05-04 DIAGNOSIS — F419 Anxiety disorder, unspecified: Secondary | ICD-10-CM | POA: Diagnosis not present

## 2023-06-04 DIAGNOSIS — F419 Anxiety disorder, unspecified: Secondary | ICD-10-CM | POA: Diagnosis not present

## 2023-06-04 DIAGNOSIS — F32A Depression, unspecified: Secondary | ICD-10-CM | POA: Diagnosis not present

## 2023-06-04 DIAGNOSIS — F17200 Nicotine dependence, unspecified, uncomplicated: Secondary | ICD-10-CM | POA: Diagnosis not present

## 2023-06-04 DIAGNOSIS — F1120 Opioid dependence, uncomplicated: Secondary | ICD-10-CM | POA: Diagnosis not present

## 2023-06-26 ENCOUNTER — Encounter: Payer: 59 | Admitting: Internal Medicine

## 2023-06-27 ENCOUNTER — Other Ambulatory Visit: Payer: Self-pay | Admitting: Internal Medicine

## 2023-07-05 DIAGNOSIS — F32A Depression, unspecified: Secondary | ICD-10-CM | POA: Diagnosis not present

## 2023-07-05 DIAGNOSIS — F419 Anxiety disorder, unspecified: Secondary | ICD-10-CM | POA: Diagnosis not present

## 2023-07-05 DIAGNOSIS — F17200 Nicotine dependence, unspecified, uncomplicated: Secondary | ICD-10-CM | POA: Diagnosis not present

## 2023-07-05 DIAGNOSIS — F1120 Opioid dependence, uncomplicated: Secondary | ICD-10-CM | POA: Diagnosis not present

## 2023-07-25 ENCOUNTER — Other Ambulatory Visit: Payer: Self-pay | Admitting: Internal Medicine

## 2023-07-26 DIAGNOSIS — F1120 Opioid dependence, uncomplicated: Secondary | ICD-10-CM | POA: Diagnosis not present

## 2023-07-31 DIAGNOSIS — F1120 Opioid dependence, uncomplicated: Secondary | ICD-10-CM | POA: Diagnosis not present

## 2023-08-12 ENCOUNTER — Ambulatory Visit: Payer: 59 | Admitting: Internal Medicine

## 2023-08-30 DIAGNOSIS — F1120 Opioid dependence, uncomplicated: Secondary | ICD-10-CM | POA: Diagnosis not present

## 2023-08-31 DIAGNOSIS — F1120 Opioid dependence, uncomplicated: Secondary | ICD-10-CM | POA: Diagnosis not present

## 2023-09-07 DIAGNOSIS — F17290 Nicotine dependence, other tobacco product, uncomplicated: Secondary | ICD-10-CM | POA: Diagnosis not present

## 2023-09-07 DIAGNOSIS — F1120 Opioid dependence, uncomplicated: Secondary | ICD-10-CM | POA: Diagnosis not present

## 2023-09-14 DIAGNOSIS — F1120 Opioid dependence, uncomplicated: Secondary | ICD-10-CM | POA: Diagnosis not present

## 2023-09-14 DIAGNOSIS — F17290 Nicotine dependence, other tobacco product, uncomplicated: Secondary | ICD-10-CM | POA: Diagnosis not present

## 2023-09-21 DIAGNOSIS — F1120 Opioid dependence, uncomplicated: Secondary | ICD-10-CM | POA: Diagnosis not present

## 2023-09-21 DIAGNOSIS — F17290 Nicotine dependence, other tobacco product, uncomplicated: Secondary | ICD-10-CM | POA: Diagnosis not present

## 2023-09-25 DIAGNOSIS — F1120 Opioid dependence, uncomplicated: Secondary | ICD-10-CM | POA: Diagnosis not present

## 2023-09-28 DIAGNOSIS — F1120 Opioid dependence, uncomplicated: Secondary | ICD-10-CM | POA: Diagnosis not present

## 2023-09-28 DIAGNOSIS — F17290 Nicotine dependence, other tobacco product, uncomplicated: Secondary | ICD-10-CM | POA: Diagnosis not present

## 2023-10-01 DIAGNOSIS — R059 Cough, unspecified: Secondary | ICD-10-CM | POA: Diagnosis not present

## 2023-10-01 DIAGNOSIS — R0602 Shortness of breath: Secondary | ICD-10-CM | POA: Diagnosis not present

## 2023-10-05 DIAGNOSIS — F1120 Opioid dependence, uncomplicated: Secondary | ICD-10-CM | POA: Diagnosis not present

## 2023-10-05 DIAGNOSIS — F1310 Sedative, hypnotic or anxiolytic abuse, uncomplicated: Secondary | ICD-10-CM | POA: Diagnosis not present

## 2023-10-05 DIAGNOSIS — F17290 Nicotine dependence, other tobacco product, uncomplicated: Secondary | ICD-10-CM | POA: Diagnosis not present

## 2023-10-12 DIAGNOSIS — F17290 Nicotine dependence, other tobacco product, uncomplicated: Secondary | ICD-10-CM | POA: Diagnosis not present

## 2023-10-12 DIAGNOSIS — F1120 Opioid dependence, uncomplicated: Secondary | ICD-10-CM | POA: Diagnosis not present

## 2023-10-12 DIAGNOSIS — F1310 Sedative, hypnotic or anxiolytic abuse, uncomplicated: Secondary | ICD-10-CM | POA: Diagnosis not present

## 2023-10-19 DIAGNOSIS — F1120 Opioid dependence, uncomplicated: Secondary | ICD-10-CM | POA: Diagnosis not present

## 2023-10-19 DIAGNOSIS — F17290 Nicotine dependence, other tobacco product, uncomplicated: Secondary | ICD-10-CM | POA: Diagnosis not present

## 2023-10-19 DIAGNOSIS — F1310 Sedative, hypnotic or anxiolytic abuse, uncomplicated: Secondary | ICD-10-CM | POA: Diagnosis not present

## 2023-10-26 DIAGNOSIS — F1310 Sedative, hypnotic or anxiolytic abuse, uncomplicated: Secondary | ICD-10-CM | POA: Diagnosis not present

## 2023-10-26 DIAGNOSIS — F17290 Nicotine dependence, other tobacco product, uncomplicated: Secondary | ICD-10-CM | POA: Diagnosis not present

## 2023-10-26 DIAGNOSIS — F1120 Opioid dependence, uncomplicated: Secondary | ICD-10-CM | POA: Diagnosis not present

## 2023-11-02 DIAGNOSIS — F1310 Sedative, hypnotic or anxiolytic abuse, uncomplicated: Secondary | ICD-10-CM | POA: Diagnosis not present

## 2023-11-02 DIAGNOSIS — F17290 Nicotine dependence, other tobacco product, uncomplicated: Secondary | ICD-10-CM | POA: Diagnosis not present

## 2023-11-02 DIAGNOSIS — F1120 Opioid dependence, uncomplicated: Secondary | ICD-10-CM | POA: Diagnosis not present

## 2023-11-09 DIAGNOSIS — F1310 Sedative, hypnotic or anxiolytic abuse, uncomplicated: Secondary | ICD-10-CM | POA: Diagnosis not present

## 2023-11-09 DIAGNOSIS — F17290 Nicotine dependence, other tobacco product, uncomplicated: Secondary | ICD-10-CM | POA: Diagnosis not present

## 2023-11-09 DIAGNOSIS — F1120 Opioid dependence, uncomplicated: Secondary | ICD-10-CM | POA: Diagnosis not present

## 2023-11-16 DIAGNOSIS — F1120 Opioid dependence, uncomplicated: Secondary | ICD-10-CM | POA: Diagnosis not present

## 2023-11-16 DIAGNOSIS — F1310 Sedative, hypnotic or anxiolytic abuse, uncomplicated: Secondary | ICD-10-CM | POA: Diagnosis not present

## 2023-11-16 DIAGNOSIS — F17290 Nicotine dependence, other tobacco product, uncomplicated: Secondary | ICD-10-CM | POA: Diagnosis not present

## 2023-11-23 DIAGNOSIS — F17290 Nicotine dependence, other tobacco product, uncomplicated: Secondary | ICD-10-CM | POA: Diagnosis not present

## 2023-11-23 DIAGNOSIS — F1120 Opioid dependence, uncomplicated: Secondary | ICD-10-CM | POA: Diagnosis not present

## 2023-11-23 DIAGNOSIS — F1310 Sedative, hypnotic or anxiolytic abuse, uncomplicated: Secondary | ICD-10-CM | POA: Diagnosis not present

## 2023-11-29 DIAGNOSIS — R0989 Other specified symptoms and signs involving the circulatory and respiratory systems: Secondary | ICD-10-CM | POA: Diagnosis not present

## 2023-11-29 DIAGNOSIS — R112 Nausea with vomiting, unspecified: Secondary | ICD-10-CM | POA: Diagnosis not present

## 2023-11-29 DIAGNOSIS — R059 Cough, unspecified: Secondary | ICD-10-CM | POA: Diagnosis not present

## 2023-12-07 DIAGNOSIS — F17290 Nicotine dependence, other tobacco product, uncomplicated: Secondary | ICD-10-CM | POA: Diagnosis not present

## 2023-12-07 DIAGNOSIS — F1121 Opioid dependence, in remission: Secondary | ICD-10-CM | POA: Diagnosis not present

## 2023-12-07 DIAGNOSIS — F1310 Sedative, hypnotic or anxiolytic abuse, uncomplicated: Secondary | ICD-10-CM | POA: Diagnosis not present

## 2023-12-21 DIAGNOSIS — F17290 Nicotine dependence, other tobacco product, uncomplicated: Secondary | ICD-10-CM | POA: Diagnosis not present

## 2023-12-21 DIAGNOSIS — F1121 Opioid dependence, in remission: Secondary | ICD-10-CM | POA: Diagnosis not present

## 2023-12-21 DIAGNOSIS — F1310 Sedative, hypnotic or anxiolytic abuse, uncomplicated: Secondary | ICD-10-CM | POA: Diagnosis not present

## 2024-01-04 DIAGNOSIS — F1310 Sedative, hypnotic or anxiolytic abuse, uncomplicated: Secondary | ICD-10-CM | POA: Diagnosis not present

## 2024-01-04 DIAGNOSIS — F17290 Nicotine dependence, other tobacco product, uncomplicated: Secondary | ICD-10-CM | POA: Diagnosis not present

## 2024-01-04 DIAGNOSIS — F1121 Opioid dependence, in remission: Secondary | ICD-10-CM | POA: Diagnosis not present

## 2024-01-18 DIAGNOSIS — F1310 Sedative, hypnotic or anxiolytic abuse, uncomplicated: Secondary | ICD-10-CM | POA: Diagnosis not present

## 2024-01-18 DIAGNOSIS — F17290 Nicotine dependence, other tobacco product, uncomplicated: Secondary | ICD-10-CM | POA: Diagnosis not present

## 2024-01-18 DIAGNOSIS — F1121 Opioid dependence, in remission: Secondary | ICD-10-CM | POA: Diagnosis not present

## 2024-02-01 DIAGNOSIS — F1310 Sedative, hypnotic or anxiolytic abuse, uncomplicated: Secondary | ICD-10-CM | POA: Diagnosis not present

## 2024-02-01 DIAGNOSIS — F1121 Opioid dependence, in remission: Secondary | ICD-10-CM | POA: Diagnosis not present

## 2024-02-01 DIAGNOSIS — F17290 Nicotine dependence, other tobacco product, uncomplicated: Secondary | ICD-10-CM | POA: Diagnosis not present

## 2024-02-15 DIAGNOSIS — F17290 Nicotine dependence, other tobacco product, uncomplicated: Secondary | ICD-10-CM | POA: Diagnosis not present

## 2024-02-15 DIAGNOSIS — F1121 Opioid dependence, in remission: Secondary | ICD-10-CM | POA: Diagnosis not present

## 2024-02-15 DIAGNOSIS — F1310 Sedative, hypnotic or anxiolytic abuse, uncomplicated: Secondary | ICD-10-CM | POA: Diagnosis not present

## 2024-02-29 DIAGNOSIS — F1310 Sedative, hypnotic or anxiolytic abuse, uncomplicated: Secondary | ICD-10-CM | POA: Diagnosis not present

## 2024-02-29 DIAGNOSIS — F17290 Nicotine dependence, other tobacco product, uncomplicated: Secondary | ICD-10-CM | POA: Diagnosis not present

## 2024-02-29 DIAGNOSIS — F1121 Opioid dependence, in remission: Secondary | ICD-10-CM | POA: Diagnosis not present

## 2024-03-14 DIAGNOSIS — F17290 Nicotine dependence, other tobacco product, uncomplicated: Secondary | ICD-10-CM | POA: Diagnosis not present

## 2024-03-14 DIAGNOSIS — F1121 Opioid dependence, in remission: Secondary | ICD-10-CM | POA: Diagnosis not present

## 2024-03-14 DIAGNOSIS — F1310 Sedative, hypnotic or anxiolytic abuse, uncomplicated: Secondary | ICD-10-CM | POA: Diagnosis not present

## 2024-03-28 DIAGNOSIS — F1510 Other stimulant abuse, uncomplicated: Secondary | ICD-10-CM | POA: Diagnosis not present

## 2024-03-28 DIAGNOSIS — F1310 Sedative, hypnotic or anxiolytic abuse, uncomplicated: Secondary | ICD-10-CM | POA: Diagnosis not present

## 2024-03-28 DIAGNOSIS — F17290 Nicotine dependence, other tobacco product, uncomplicated: Secondary | ICD-10-CM | POA: Diagnosis not present

## 2024-03-28 DIAGNOSIS — F1121 Opioid dependence, in remission: Secondary | ICD-10-CM | POA: Diagnosis not present
